# Patient Record
Sex: Male | Born: 1955 | Race: White | Hispanic: No | Marital: Married | Smoking: Former smoker
Health system: Southern US, Community
[De-identification: ages and names within clinical notes are randomized; demographics above are authoritative.]

## PROBLEM LIST (undated history)

## (undated) DIAGNOSIS — I10 Essential (primary) hypertension: Secondary | ICD-10-CM

## (undated) DIAGNOSIS — F32A Depression, unspecified: Secondary | ICD-10-CM

## (undated) DIAGNOSIS — F329 Major depressive disorder, single episode, unspecified: Secondary | ICD-10-CM

## (undated) DIAGNOSIS — M1990 Unspecified osteoarthritis, unspecified site: Secondary | ICD-10-CM

## (undated) DIAGNOSIS — J189 Pneumonia, unspecified organism: Secondary | ICD-10-CM

## (undated) DIAGNOSIS — G4730 Sleep apnea, unspecified: Secondary | ICD-10-CM

## (undated) DIAGNOSIS — Z86718 Personal history of other venous thrombosis and embolism: Secondary | ICD-10-CM

## (undated) HISTORY — PX: JOINT REPLACEMENT: SHX530

## (undated) HISTORY — PX: COLONOSCOPY: SHX174

## (undated) HISTORY — PX: IVC FILTER INSERTION: CATH118245

## (undated) HISTORY — PX: VASOTOMY: SUR1432

## (undated) HISTORY — PX: ESOPHAGOGASTRODUODENOSCOPY: SHX1529

## (undated) HISTORY — PX: BACK SURGERY: SHX140

---

## 2016-10-31 ENCOUNTER — Other Ambulatory Visit: Payer: Self-pay | Admitting: Orthopedic Surgery

## 2016-10-31 DIAGNOSIS — M5416 Radiculopathy, lumbar region: Secondary | ICD-10-CM

## 2016-11-04 ENCOUNTER — Other Ambulatory Visit: Payer: Self-pay | Admitting: Orthopedic Surgery

## 2016-11-04 DIAGNOSIS — M545 Low back pain, unspecified: Secondary | ICD-10-CM

## 2016-11-04 DIAGNOSIS — T1590XA Foreign body on external eye, part unspecified, unspecified eye, initial encounter: Secondary | ICD-10-CM

## 2016-11-14 ENCOUNTER — Ambulatory Visit
Admission: RE | Admit: 2016-11-14 | Discharge: 2016-11-14 | Disposition: A | Discharge status: Home / Self Care | Payer: Medicare PPO | Source: Ambulatory Visit | Attending: Orthopedic Surgery | Admitting: Orthopedic Surgery

## 2016-11-14 DIAGNOSIS — T1590XA Foreign body on external eye, part unspecified, unspecified eye, initial encounter: Secondary | ICD-10-CM

## 2016-11-14 DIAGNOSIS — M545 Low back pain, unspecified: Secondary | ICD-10-CM

## 2016-11-14 DIAGNOSIS — M5416 Radiculopathy, lumbar region: Secondary | ICD-10-CM

## 2017-11-05 ENCOUNTER — Other Ambulatory Visit: Payer: Self-pay | Admitting: Orthopedic Surgery

## 2017-11-10 NOTE — Pre-Procedure Instructions (Signed)
Patience Muscahomas Denison  11/10/2017      [id number] - [place], [place] - [id number] [place] [id number] Phone: [phone] Fax: [phone]    Your procedure is scheduled on June 13  [facility] at 1000 A.M.  Call this number if you have problems the morning of surgery:  [phone]   Remember:  No food or drink after midnight.     Take these medicines the morning of surgery with A SIP OF WATER  Cyclobenzaprine (Flexeril)  Gabapentin (Neurontin) Hydrocodone (Norco) if needed Methocarbamol (Robaxin) Escitalopram (Lexapro)  Stop taking Eliquis as directed by your Dr. Stop taking Celebrex, Aspirin, BC's, goody's, Herbal medications, Fish Oil, Aleve, Ibuprofen, Advil, motrin, Vitamins     Do not wear jewelry, make-up or nail polish.  Do not wear lotions, powders, or perfumes, or deodorant.  Do not shave 48 hours prior to surgery.  Men may shave face and neck.  Do not bring valuables to the hospital.  [facility] is not responsible for any belongings or valuables.  Contacts, dentures or bridgework may not be worn into surgery.  Leave your suitcase in the car.  After surgery it may be brought to your room.  For patients admitted to the hospital, discharge time will be determined by your treatment team.  Patients discharged the day of surgery will not be allowed to drive home.    Special instructions:   [facility]- Preparing For Surgery  Before surgery, you can play an important role. Because skin is not sterile, your skin needs to be as free of germs as possible. You can reduce the number of germs on your skin by washing with CHG (chlorahexidine gluconate) Soap before surgery.  CHG is an antiseptic cleaner which kills germs and bonds with the skin to continue killing germs even after washing.    Oral Hygiene is also important to reduce your risk of infection.  Remember - BRUSH YOUR TEETH THE MORNING OF SURGERY  WITH YOUR REGULAR TOOTHPASTE  Please do not use if you have an allergy to CHG or antibacterial soaps. If your skin becomes reddened/irritated stop using the CHG.  Do not shave (including legs and underarms) for at least 48 hours prior to first CHG shower. It is OK to shave your face.  Please follow these instructions carefully.   1. Shower the NIGHT BEFORE SURGERY and the MORNING OF SURGERY with CHG.   2. If you chose to wash your hair, wash your hair first as usual with your normal shampoo.  3. After you shampoo, rinse your hair and body thoroughly to remove the shampoo.  4. Use CHG as you would any other liquid soap. You can apply CHG directly to the skin and wash gently with a scrungie or a clean washcloth.   5. Apply the CHG Soap to your body ONLY FROM THE NECK DOWN.  Do not use on open wounds or open sores. Avoid contact with your eyes, ears, mouth and genitals (private parts). Wash Face and genitals (private parts)  with your normal soap.  6. Wash thoroughly, paying special attention to the area where your surgery will be performed.  7. Thoroughly rinse your body with warm water from the neck down.  8. DO NOT shower/wash with your normal soap after using and rinsing off the CHG Soap.  9. Pat yourself dry with a CLEAN TOWEL.  10. Wear CLEAN PAJAMAS to bed the night before surgery, wear  comfortable clothes the morning of surgery  11. Place CLEAN SHEETS on your bed the night of your first shower and DO NOT SLEEP WITH PETS.    Day of Surgery:  Do not apply any deodorants/lotions.  Please wear clean clothes to the hospital/surgery center.   Remember to brush your teeth WITH YOUR REGULAR TOOTHPASTE.    Please read over the following fact sheets that you were given. Pain Booklet, Coughing and Deep Breathing, MRSA Information and Surgical Site Infection Prevention, incentive spirometry

## 2017-11-11 ENCOUNTER — Other Ambulatory Visit: Payer: Self-pay

## 2017-11-11 ENCOUNTER — Encounter (HOSPITAL_COMMUNITY)
Admission: RE | Admit: 2017-11-11 | Discharge: 2017-11-11 | Disposition: A | Discharge status: Home / Self Care | Payer: Medicare HMO | Source: Ambulatory Visit | Attending: Orthopedic Surgery | Admitting: Orthopedic Surgery

## 2017-11-11 ENCOUNTER — Encounter (HOSPITAL_COMMUNITY): Payer: Self-pay

## 2017-11-11 DIAGNOSIS — Z01818 Encounter for other preprocedural examination: Secondary | ICD-10-CM | POA: Insufficient documentation

## 2017-11-11 DIAGNOSIS — Z96649 Presence of unspecified artificial hip joint: Secondary | ICD-10-CM

## 2017-11-11 DIAGNOSIS — G4730 Sleep apnea, unspecified: Secondary | ICD-10-CM | POA: Diagnosis not present

## 2017-11-11 DIAGNOSIS — F329 Major depressive disorder, single episode, unspecified: Secondary | ICD-10-CM | POA: Diagnosis not present

## 2017-11-11 DIAGNOSIS — I10 Essential (primary) hypertension: Secondary | ICD-10-CM | POA: Diagnosis not present

## 2017-11-11 DIAGNOSIS — Z791 Long term (current) use of non-steroidal anti-inflammatories (NSAID): Secondary | ICD-10-CM | POA: Diagnosis not present

## 2017-11-11 DIAGNOSIS — Z96643 Presence of artificial hip joint, bilateral: Secondary | ICD-10-CM | POA: Diagnosis not present

## 2017-11-11 DIAGNOSIS — Z79891 Long term (current) use of opiate analgesic: Secondary | ICD-10-CM | POA: Diagnosis not present

## 2017-11-11 DIAGNOSIS — Z86718 Personal history of other venous thrombosis and embolism: Secondary | ICD-10-CM

## 2017-11-11 DIAGNOSIS — M5412 Radiculopathy, cervical region: Secondary | ICD-10-CM | POA: Diagnosis not present

## 2017-11-11 DIAGNOSIS — M4802 Spinal stenosis, cervical region: Secondary | ICD-10-CM | POA: Diagnosis not present

## 2017-11-11 DIAGNOSIS — M79602 Pain in left arm: Secondary | ICD-10-CM | POA: Diagnosis present

## 2017-11-11 DIAGNOSIS — Z87891 Personal history of nicotine dependence: Secondary | ICD-10-CM | POA: Diagnosis not present

## 2017-11-11 DIAGNOSIS — Z79899 Other long term (current) drug therapy: Secondary | ICD-10-CM | POA: Diagnosis not present

## 2017-11-11 HISTORY — DX: Major depressive disorder, single episode, unspecified: F32.9

## 2017-11-11 HISTORY — DX: Essential (primary) hypertension: I10

## 2017-11-11 HISTORY — DX: Depression, unspecified: F32.A

## 2017-11-11 HISTORY — DX: Sleep apnea, unspecified: G47.30

## 2017-11-11 HISTORY — DX: Unspecified osteoarthritis, unspecified site: M19.90

## 2017-11-11 HISTORY — DX: Personal history of other venous thrombosis and embolism: Z86.718

## 2017-11-11 HISTORY — DX: Pneumonia, unspecified organism: J18.9

## 2017-11-11 LAB — CBC WITH DIFFERENTIAL/PLATELET
Abs Immature Granulocytes: 0 10*3/uL (ref 0.0–0.1)
BASOS PCT: 0 %
Basophils Absolute: 0 10*3/uL (ref 0.0–0.1)
EOS ABS: 0.1 10*3/uL (ref 0.0–0.7)
EOS PCT: 2 %
HEMATOCRIT: 44 % (ref 39.0–52.0)
Hemoglobin: 14.6 g/dL (ref 13.0–17.0)
IMMATURE GRANULOCYTES: 0 %
LYMPHS ABS: 1.3 10*3/uL (ref 0.7–4.0)
Lymphocytes Relative: 19 %
MCH: 32.5 pg (ref 26.0–34.0)
MCHC: 33.2 g/dL (ref 30.0–36.0)
MCV: 98 fL (ref 78.0–100.0)
Monocytes Absolute: 0.6 10*3/uL (ref 0.1–1.0)
Monocytes Relative: 9 %
NEUTROS PCT: 70 %
Neutro Abs: 4.7 10*3/uL (ref 1.7–7.7)
Platelets: 211 10*3/uL (ref 150–400)
RBC: 4.49 MIL/uL (ref 4.22–5.81)
RDW: 12.1 % (ref 11.5–15.5)
WBC: 6.7 10*3/uL (ref 4.0–10.5)

## 2017-11-11 LAB — URINALYSIS, ROUTINE W REFLEX MICROSCOPIC
BACTERIA UA: NONE SEEN
BILIRUBIN URINE: NEGATIVE
Glucose, UA: NEGATIVE mg/dL
Hgb urine dipstick: NEGATIVE
KETONES UR: NEGATIVE mg/dL
LEUKOCYTES UA: NEGATIVE
NITRITE: NEGATIVE
Protein, ur: NEGATIVE mg/dL
Specific Gravity, Urine: 1.015 (ref 1.005–1.030)
pH: 5 (ref 5.0–8.0)

## 2017-11-11 LAB — COMPREHENSIVE METABOLIC PANEL
ALT: 43 U/L (ref 17–63)
AST: 35 U/L (ref 15–41)
Albumin: 4.3 g/dL (ref 3.5–5.0)
Alkaline Phosphatase: 62 U/L (ref 38–126)
Anion gap: 9 (ref 5–15)
BILIRUBIN TOTAL: 0.6 mg/dL (ref 0.3–1.2)
BUN: 10 mg/dL (ref 6–20)
CO2: 26 mmol/L (ref 22–32)
CREATININE: 1.05 mg/dL (ref 0.61–1.24)
Calcium: 9.6 mg/dL (ref 8.9–10.3)
Chloride: 104 mmol/L (ref 101–111)
GFR calc non Af Amer: >60 mL/min (ref >60)
Glucose, Bld: 144 mg/dL — ABNORMAL HIGH (ref 65–99)
POTASSIUM: 4.3 mmol/L (ref 3.5–5.1)
Sodium: 139 mmol/L (ref 135–145)
Total Protein: 7.4 g/dL (ref 6.5–8.1)

## 2017-11-11 LAB — ABO/RH: ABO/RH(D): A POS

## 2017-11-11 LAB — PROTIME-INR
INR: 0.99
PROTHROMBIN TIME: 13 s (ref 11.4–15.2)

## 2017-11-11 LAB — TYPE AND SCREEN
ABO/RH(D): A POS
ANTIBODY SCREEN: NEGATIVE

## 2017-11-11 LAB — SURGICAL PCR SCREEN
MRSA, PCR: NEGATIVE
STAPHYLOCOCCUS AUREUS: NEGATIVE

## 2017-11-11 LAB — APTT: aPTT: 26 seconds (ref 24–36)

## 2017-11-11 NOTE — Progress Notes (Signed)
PCP is Dr. Barney DrainMoogali Arvind Denies seeing a cardiologist. Denies chest pain, cough, or fever. States last dose of Eliquis was 11-05-17 States he had lots of blood clots after his first hip replacement and was place on Eliquis along with having an IVC filter placed  Instructed to bring CPAP mask on the day of surgery- voices understanding. Requested discharge summary and EKG done at [facility] last month. Requested EKG done at Dr Flossie BuffyArvind's office and last office note.

## 2017-11-12 NOTE — Progress Notes (Signed)
Anesthesia Chart Review:   Case:  [id number] Date/Time:  11/13/17 1145   Procedure:  ANTERIOR CERVICAL DECOMPRESSION FUSION, CERVICAL 4-5, CERVICAL 5-6, CERVICAL 6-7 WITH INSTRUMENTATION AND ALLOGRAFT.  TIME REQUESTED 4 HOURS (Bilateral )   Anesthesia type:  General   Pre-op diagnosis:  BILATERAL ARM PAIN   Location:  [facility] ROOM 05 / [facility] OR   Surgeon:  Estill Bambergumonski, Mark, MD      DISCUSSION: - Pt is a 62 year old male with hx HTN, OSA.  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   - Last dose eliquis 11/05/17   VS: BP (!) 164/88   Pulse 92   Temp 36.8 C   Resp 20   Ht 5\' 9"  (1.753 m)   Wt 213 lb 3.2 oz (96.7 kg)   SpO2 99%   BMI 31.48 kg/m   PROVIDERS: PCP is Karle PlumberArvind, Moogali M, MD   LABS: Labs reviewed: Acceptable for surgery. (all labs ordered are listed, but only abnormal results are displayed)  Labs Reviewed  COMPREHENSIVE METABOLIC PANEL - Abnormal; Notable for the following components:      Result Value   Glucose, Bld 144 (*)    All other components within normal limits  SURGICAL PCR SCREEN  APTT  CBC WITH DIFFERENTIAL/PLATELET  PROTIME-INR  URINALYSIS, ROUTINE W REFLEX MICROSCOPIC  TYPE AND SCREEN  ABO/RH     IMAGES:  CXR 11/11/17: No acute cardiopulmonary disease.   [facility]. If it does not arrive in time, EKG will be obtained day of surgery     Past Medical History:  Diagnosis Date  . Arthritis   . Depression   . 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 Hypertension   . Pneumonia   . Sleep apnea     Past Surgical History:  Procedure Laterality Date  . BACK SURGERY     times 2  . COLONOSCOPY    . ESOPHAGOGASTRODUODENOSCOPY    . IVC FILTER INSERTION    . JOINT REPLACEMENT Bilateral   . VASOTOMY      MEDICATIONS: . apixaban (ELIQUIS) 2.5 MG TABS tablet  . celecoxib (CELEBREX) 200 MG capsule  . cyclobenzaprine (FLEXERIL) 5 MG tablet  . escitalopram (LEXAPRO) 10  MG tablet  . gabapentin (NEURONTIN) 600 MG tablet  . HYDROcodone-acetaminophen (NORCO) 10-325 MG tablet  . lisinopril (PRINIVIL,ZESTRIL) 10 MG tablet  . methocarbamol (ROBAXIN) 500 MG tablet  . traZODone (DESYREL) 150 MG tablet   No current facility-administered medications for this encounter.     If EKG acceptable day of surgery, I anticipate pt can proceed with surgery as scheduled.  Rica Mastngela Nidya Bouyer, FNP-BC [facility]Anesthesiology Phone: [phone] 11/12/2017 4:44 PM

## 2017-11-13 ENCOUNTER — Ambulatory Visit (HOSPITAL_COMMUNITY): Payer: Medicare HMO

## 2017-11-13 ENCOUNTER — Encounter (HOSPITAL_COMMUNITY): Payer: Self-pay

## 2017-11-13 ENCOUNTER — Ambulatory Visit (HOSPITAL_COMMUNITY): Payer: Medicare HMO | Admitting: Anesthesiology

## 2017-11-13 ENCOUNTER — Observation Stay (HOSPITAL_COMMUNITY)
Admission: RE | Admit: 2017-11-13 | Discharge: 2017-11-14 | Disposition: A | Discharge status: Home / Self Care | Payer: Medicare HMO | Source: Ambulatory Visit | Attending: Orthopedic Surgery | Admitting: Orthopedic Surgery

## 2017-11-13 ENCOUNTER — Encounter (HOSPITAL_COMMUNITY)
Admission: RE | Disposition: A | Discharge status: Home / Self Care | Payer: Self-pay | Source: Ambulatory Visit | Attending: Orthopedic Surgery

## 2017-11-13 ENCOUNTER — Ambulatory Visit (HOSPITAL_COMMUNITY): Payer: Medicare HMO | Admitting: Emergency Medicine

## 2017-11-13 DIAGNOSIS — G4730 Sleep apnea, unspecified: Secondary | ICD-10-CM | POA: Insufficient documentation

## 2017-11-13 DIAGNOSIS — Z79899 Other long term (current) drug therapy: Secondary | ICD-10-CM | POA: Insufficient documentation

## 2017-11-13 DIAGNOSIS — Z419 Encounter for procedure for purposes other than remedying health state, unspecified: Secondary | ICD-10-CM

## 2017-11-13 DIAGNOSIS — F329 Major depressive disorder, single episode, unspecified: Secondary | ICD-10-CM | POA: Insufficient documentation

## 2017-11-13 DIAGNOSIS — M5412 Radiculopathy, cervical region: Principal | ICD-10-CM | POA: Insufficient documentation

## 2017-11-13 DIAGNOSIS — Z96643 Presence of artificial hip joint, bilateral: Secondary | ICD-10-CM | POA: Insufficient documentation

## 2017-11-13 DIAGNOSIS — Z87891 Personal history of nicotine dependence: Secondary | ICD-10-CM | POA: Insufficient documentation

## 2017-11-13 DIAGNOSIS — M4802 Spinal stenosis, cervical region: Secondary | ICD-10-CM | POA: Diagnosis not present

## 2017-11-13 DIAGNOSIS — I10 Essential (primary) hypertension: Secondary | ICD-10-CM | POA: Insufficient documentation

## 2017-11-13 DIAGNOSIS — M5410 Radiculopathy, site unspecified: Secondary | ICD-10-CM | POA: Diagnosis present

## 2017-11-13 DIAGNOSIS — Z791 Long term (current) use of non-steroidal anti-inflammatories (NSAID): Secondary | ICD-10-CM | POA: Insufficient documentation

## 2017-11-13 DIAGNOSIS — Z79891 Long term (current) use of opiate analgesic: Secondary | ICD-10-CM | POA: Insufficient documentation

## 2017-11-13 HISTORY — PX: ANTERIOR CERVICAL DECOMP/DISCECTOMY FUSION: SHX1161

## 2017-11-13 SURGERY — ANTERIOR CERVICAL DECOMPRESSION/DISCECTOMY FUSION 2 LEVELS
Anesthesia: General | Laterality: Bilateral

## 2017-11-13 MED ORDER — DIAZEPAM 5 MG PO TABS
5.0000 mg | ORAL_TABLET | Freq: Four times a day (QID) | ORAL | Status: DC | PRN
  Administered 2017-11-13 – 2017-11-14 (×3): 5 mg via ORAL
  Filled 2017-11-13 (×2): qty 1

## 2017-11-13 MED ORDER — MIDAZOLAM HCL 2 MG/2ML IJ SOLN
INTRAMUSCULAR | Status: AC
  Filled 2017-11-13: qty 2

## 2017-11-13 MED ORDER — PANTOPRAZOLE SODIUM 40 MG PO TBEC: 40.0000 mg | DELAYED_RELEASE_TABLET | Freq: Every day | ORAL | Status: DC

## 2017-11-13 MED ORDER — OXYCODONE-ACETAMINOPHEN 5-325 MG PO TABS
1.0000 | ORAL_TABLET | ORAL | Status: DC | PRN
  Administered 2017-11-13 – 2017-11-14 (×5): 2 via ORAL
  Filled 2017-11-13 (×4): qty 2

## 2017-11-13 MED ORDER — DOCUSATE SODIUM 100 MG PO CAPS
100.0000 mg | ORAL_CAPSULE | Freq: Two times a day (BID) | ORAL | Status: DC
  Administered 2017-11-13: 100 mg via ORAL
  Filled 2017-11-13: qty 1

## 2017-11-13 MED ORDER — ONDANSETRON HCL 4 MG/2ML IJ SOLN
INTRAMUSCULAR | Status: AC
  Filled 2017-11-13: qty 2

## 2017-11-13 MED ORDER — PHENYLEPHRINE 40 MCG/ML (10ML) SYRINGE FOR IV PUSH (FOR BLOOD PRESSURE SUPPORT)
PREFILLED_SYRINGE | INTRAVENOUS | Status: DC | PRN
  Administered 2017-11-13 (×3): 80 ug via INTRAVENOUS

## 2017-11-13 MED ORDER — SODIUM CHLORIDE 0.9% FLUSH: 3.0000 mL | Freq: Two times a day (BID) | INTRAVENOUS | Status: DC

## 2017-11-13 MED ORDER — FENTANYL CITRATE (PF) 250 MCG/5ML IJ SOLN
INTRAMUSCULAR | Status: AC
  Filled 2017-11-13: qty 5

## 2017-11-13 MED ORDER — ONDANSETRON HCL 4 MG/2ML IJ SOLN
INTRAMUSCULAR | Status: DC | PRN
  Administered 2017-11-13: 4 mg via INTRAVENOUS

## 2017-11-13 MED ORDER — HYDROMORPHONE HCL 1 MG/ML IJ SOLN
INTRAMUSCULAR | Status: AC
  Filled 2017-11-13: qty 0.5

## 2017-11-13 MED ORDER — CEFAZOLIN SODIUM-DEXTROSE 2-4 GM/100ML-% IV SOLN
2.0000 g | Freq: Three times a day (TID) | INTRAVENOUS | Status: AC
  Administered 2017-11-13 – 2017-11-14 (×2): 2 g via INTRAVENOUS
  Filled 2017-11-13 (×2): qty 100

## 2017-11-13 MED ORDER — PHENYLEPHRINE HCL 10 MG/ML IJ SOLN
INTRAMUSCULAR | Status: DC | PRN
  Administered 2017-11-13: 60 ug/min via INTRAVENOUS

## 2017-11-13 MED ORDER — ONDANSETRON HCL 4 MG/2ML IJ SOLN: 4.0000 mg | Freq: Four times a day (QID) | INTRAMUSCULAR | Status: DC | PRN

## 2017-11-13 MED ORDER — PHENYLEPHRINE 40 MCG/ML (10ML) SYRINGE FOR IV PUSH (FOR BLOOD PRESSURE SUPPORT)
PREFILLED_SYRINGE | INTRAVENOUS | Status: AC
  Filled 2017-11-13: qty 10

## 2017-11-13 MED ORDER — SUGAMMADEX SODIUM 200 MG/2ML IV SOLN
INTRAVENOUS | Status: AC
  Filled 2017-11-13: qty 2

## 2017-11-13 MED ORDER — BUPIVACAINE-EPINEPHRINE 0.25% -1:200000 IJ SOLN
INTRAMUSCULAR | Status: DC | PRN
  Administered 2017-11-13: 10 mL

## 2017-11-13 MED ORDER — TRAZODONE HCL 150 MG PO TABS
150.0000 mg | ORAL_TABLET | Freq: Every day | ORAL | Status: DC
  Administered 2017-11-13: 150 mg via ORAL
  Filled 2017-11-13: qty 1

## 2017-11-13 MED ORDER — ESCITALOPRAM OXALATE 10 MG PO TABS
10.0000 mg | ORAL_TABLET | Freq: Every day | ORAL | Status: DC
  Filled 2017-11-13: qty 1

## 2017-11-13 MED ORDER — ARTIFICIAL TEARS OPHTHALMIC OINT
TOPICAL_OINTMENT | OPHTHALMIC | Status: AC
  Filled 2017-11-13: qty 3.5

## 2017-11-13 MED ORDER — LACTATED RINGERS IV SOLN
INTRAVENOUS | Status: DC | PRN
  Administered 2017-11-13 (×2): via INTRAVENOUS

## 2017-11-13 MED ORDER — LABETALOL HCL 5 MG/ML IV SOLN
INTRAVENOUS | Status: AC
  Filled 2017-11-13: qty 4

## 2017-11-13 MED ORDER — ARTIFICIAL TEARS OPHTHALMIC OINT
TOPICAL_OINTMENT | OPHTHALMIC | Status: DC | PRN
  Administered 2017-11-13: 1 via OPHTHALMIC

## 2017-11-13 MED ORDER — HYDROMORPHONE HCL 2 MG/ML IJ SOLN
0.2500 mg | INTRAMUSCULAR | Status: DC | PRN
  Administered 2017-11-13: 0.5 mg via INTRAVENOUS

## 2017-11-13 MED ORDER — SUGAMMADEX SODIUM 200 MG/2ML IV SOLN
INTRAVENOUS | Status: DC | PRN
  Administered 2017-11-13: 300 mg via INTRAVENOUS

## 2017-11-13 MED ORDER — SODIUM CHLORIDE 0.9% FLUSH: 3.0000 mL | INTRAVENOUS | Status: DC | PRN

## 2017-11-13 MED ORDER — LABETALOL HCL 5 MG/ML IV SOLN
INTRAVENOUS | Status: DC | PRN
  Administered 2017-11-13: 10 mg via INTRAVENOUS

## 2017-11-13 MED ORDER — THROMBIN 20000 UNITS EX SOLR
CUTANEOUS | Status: DC | PRN
  Administered 2017-11-13: 11:00:00 via TOPICAL

## 2017-11-13 MED ORDER — ZOLPIDEM TARTRATE 5 MG PO TABS: 5.0000 mg | ORAL_TABLET | Freq: Every evening | ORAL | Status: DC | PRN

## 2017-11-13 MED ORDER — ALUM & MAG HYDROXIDE-SIMETH 200-200-20 MG/5ML PO SUSP: 30.0000 mL | Freq: Four times a day (QID) | ORAL | Status: DC | PRN

## 2017-11-13 MED ORDER — ROCURONIUM BROMIDE 10 MG/ML (PF) SYRINGE
PREFILLED_SYRINGE | INTRAVENOUS | Status: AC
  Filled 2017-11-13: qty 10

## 2017-11-13 MED ORDER — POVIDONE-IODINE 7.5 % EX SOLN
Freq: Once | CUTANEOUS | Status: DC
  Filled 2017-11-13: qty 118

## 2017-11-13 MED ORDER — FENTANYL CITRATE (PF) 100 MCG/2ML IJ SOLN
INTRAMUSCULAR | Status: DC | PRN
  Administered 2017-11-13 (×2): 50 ug via INTRAVENOUS
  Administered 2017-11-13: 100 ug via INTRAVENOUS
  Administered 2017-11-13 (×2): 50 ug via INTRAVENOUS
  Administered 2017-11-13: 100 ug via INTRAVENOUS
  Administered 2017-11-13 (×2): 50 ug via INTRAVENOUS

## 2017-11-13 MED ORDER — DIAZEPAM 5 MG PO TABS
ORAL_TABLET | ORAL | Status: AC
  Filled 2017-11-13: qty 1

## 2017-11-13 MED ORDER — BUPIVACAINE-EPINEPHRINE (PF) 0.25% -1:200000 IJ SOLN
INTRAMUSCULAR | Status: AC
  Filled 2017-11-13: qty 30

## 2017-11-13 MED ORDER — ONDANSETRON HCL 4 MG PO TABS: 4.0000 mg | ORAL_TABLET | Freq: Four times a day (QID) | ORAL | Status: DC | PRN

## 2017-11-13 MED ORDER — MORPHINE SULFATE (PF) 2 MG/ML IV SOLN
1.0000 mg | INTRAVENOUS | Status: DC | PRN
  Administered 2017-11-13: 2 mg via INTRAVENOUS
  Filled 2017-11-13: qty 1

## 2017-11-13 MED ORDER — OXYCODONE-ACETAMINOPHEN 5-325 MG PO TABS
ORAL_TABLET | ORAL | Status: AC
  Filled 2017-11-13: qty 2

## 2017-11-13 MED ORDER — MIDAZOLAM HCL 5 MG/5ML IJ SOLN
INTRAMUSCULAR | Status: DC | PRN
  Administered 2017-11-13 (×2): 2 mg via INTRAVENOUS

## 2017-11-13 MED ORDER — PROPOFOL 10 MG/ML IV BOLUS
INTRAVENOUS | Status: DC | PRN
  Administered 2017-11-13: 200 mg via INTRAVENOUS

## 2017-11-13 MED ORDER — ROCURONIUM BROMIDE 100 MG/10ML IV SOLN
INTRAVENOUS | Status: DC | PRN
  Administered 2017-11-13: 20 mg via INTRAVENOUS
  Administered 2017-11-13: 10 mg via INTRAVENOUS
  Administered 2017-11-13: 50 mg via INTRAVENOUS
  Administered 2017-11-13: 10 mg via INTRAVENOUS
  Administered 2017-11-13: 20 mg via INTRAVENOUS
  Administered 2017-11-13: 30 mg via INTRAVENOUS

## 2017-11-13 MED ORDER — THROMBIN 20000 UNITS EX SOLR
CUTANEOUS | Status: AC
  Filled 2017-11-13: qty 20000

## 2017-11-13 MED ORDER — DEXAMETHASONE SODIUM PHOSPHATE 10 MG/ML IJ SOLN
INTRAMUSCULAR | Status: AC
  Filled 2017-11-13: qty 1

## 2017-11-13 MED ORDER — ACETAMINOPHEN 650 MG RE SUPP: 650.0000 mg | RECTAL | Status: DC | PRN

## 2017-11-13 MED ORDER — FLEET ENEMA 7-19 GM/118ML RE ENEM: 1.0000 | ENEMA | Freq: Once | RECTAL | Status: DC | PRN

## 2017-11-13 MED ORDER — LISINOPRIL 10 MG PO TABS
10.0000 mg | ORAL_TABLET | Freq: Every day | ORAL | Status: DC
  Administered 2017-11-13 – 2017-11-14 (×2): 10 mg via ORAL
  Filled 2017-11-13 (×2): qty 1

## 2017-11-13 MED ORDER — CEFAZOLIN SODIUM-DEXTROSE 2-4 GM/100ML-% IV SOLN
2.0000 g | INTRAVENOUS | Status: AC
  Administered 2017-11-13: 2 g via INTRAVENOUS
  Filled 2017-11-13: qty 100

## 2017-11-13 MED ORDER — GABAPENTIN 600 MG PO TABS
600.0000 mg | ORAL_TABLET | Freq: Three times a day (TID) | ORAL | Status: DC
  Administered 2017-11-13 (×2): 600 mg via ORAL
  Filled 2017-11-13 (×2): qty 1

## 2017-11-13 MED ORDER — PHENOL 1.4 % MT LIQD: 1.0000 | OROMUCOSAL | Status: DC | PRN

## 2017-11-13 MED ORDER — DEXAMETHASONE SODIUM PHOSPHATE 4 MG/ML IJ SOLN
INTRAMUSCULAR | Status: DC | PRN
  Administered 2017-11-13: 10 mg via INTRAVENOUS

## 2017-11-13 MED ORDER — SENNOSIDES-DOCUSATE SODIUM 8.6-50 MG PO TABS: 1.0000 | ORAL_TABLET | Freq: Every evening | ORAL | Status: DC | PRN

## 2017-11-13 MED ORDER — LIDOCAINE 2% (20 MG/ML) 5 ML SYRINGE
INTRAMUSCULAR | Status: DC | PRN
  Administered 2017-11-13: 60 mg via INTRAVENOUS

## 2017-11-13 MED ORDER — BISACODYL 5 MG PO TBEC: 5.0000 mg | DELAYED_RELEASE_TABLET | Freq: Every day | ORAL | Status: DC | PRN

## 2017-11-13 MED ORDER — LACTATED RINGERS IV SOLN
INTRAVENOUS | Status: DC
  Administered 2017-11-13: 08:00:00 via INTRAVENOUS

## 2017-11-13 MED ORDER — 0.9 % SODIUM CHLORIDE (POUR BTL) OPTIME
TOPICAL | Status: DC | PRN
  Administered 2017-11-13: 1000 mL

## 2017-11-13 MED ORDER — LISINOPRIL 10 MG PO TABS
10.0000 mg | ORAL_TABLET | Freq: Once | ORAL | Status: AC
  Administered 2017-11-13: 10 mg via ORAL
  Filled 2017-11-13: qty 1

## 2017-11-13 MED ORDER — MENTHOL 3 MG MT LOZG: 1.0000 | LOZENGE | OROMUCOSAL | Status: DC | PRN

## 2017-11-13 MED ORDER — ACETAMINOPHEN 325 MG PO TABS: 650.0000 mg | ORAL_TABLET | ORAL | Status: DC | PRN

## 2017-11-13 SURGICAL SUPPLY — 78 items
BENZOIN TINCTURE PRP APPL 2/3 (GAUZE/BANDAGES/DRESSINGS) ×3 IMPLANT
BIT DRILL NEURO 2X3.1 SFT TUCH (MISCELLANEOUS) ×1 IMPLANT
BIT DRILL SRG 14X2.2XFLT CHK (BIT) ×1 IMPLANT
BIT DRL SRG 14X2.2XFLT CHK (BIT) ×1
BLADE CLIPPER SURG (BLADE) ×3 IMPLANT
BLADE SURG 15 STRL LF DISP TIS (BLADE) ×1 IMPLANT
BLADE SURG 15 STRL SS (BLADE) ×2
BONE VIVIGEN FORMABLE 1.3CC (Bone Implant) ×6 IMPLANT
BUR MATCHSTICK NEURO 3.0 LAGG (BURR) IMPLANT
CARTRIDGE OIL MAESTRO DRILL (MISCELLANEOUS) ×1 IMPLANT
CLOSURE WOUND 1/2 X4 (GAUZE/BANDAGES/DRESSINGS) ×1
COLLAR CERV LO CONTOUR FIRM DE (SOFTGOODS) IMPLANT
CORDS BIPOLAR (ELECTRODE) ×3 IMPLANT
COVER SURGICAL LIGHT HANDLE (MISCELLANEOUS) IMPLANT
CRADLE DONUT ADULT HEAD (MISCELLANEOUS) ×3 IMPLANT
DECANTER SPIKE VIAL GLASS SM (MISCELLANEOUS) ×3 IMPLANT
DIFFUSER DRILL AIR PNEUMATIC (MISCELLANEOUS) ×3 IMPLANT
DRAIN JACKSON RD 7FR 3/32 (WOUND CARE) IMPLANT
DRAPE C-ARM 42X72 X-RAY (DRAPES) ×6 IMPLANT
DRAPE POUCH INSTRU U-SHP 10X18 (DRAPES) ×3 IMPLANT
DRAPE SURG 17X23 STRL (DRAPES) ×12 IMPLANT
DRILL BIT SKYLINE 14MM (BIT) ×2
DRILL NEURO 2X3.1 SOFT TOUCH (MISCELLANEOUS) ×3
DURAPREP 26ML APPLICATOR (WOUND CARE) ×3 IMPLANT
ELECT COATED BLADE 2.86 ST (ELECTRODE) ×3 IMPLANT
ELECT REM PT RETURN 9FT ADLT (ELECTROSURGICAL) ×3
ELECTRODE REM PT RTRN 9FT ADLT (ELECTROSURGICAL) ×1 IMPLANT
EVACUATOR SILICONE 100CC (DRAIN) IMPLANT
GAUZE SPONGE 4X4 12PLY STRL (GAUZE/BANDAGES/DRESSINGS) ×3 IMPLANT
GAUZE SPONGE 4X4 16PLY XRAY LF (GAUZE/BANDAGES/DRESSINGS) ×3 IMPLANT
GLOVE BIO SURGEON STRL SZ7 (GLOVE) ×3 IMPLANT
GLOVE BIO SURGEON STRL SZ8 (GLOVE) ×3 IMPLANT
GLOVE BIOGEL PI IND STRL 7.0 (GLOVE) ×2 IMPLANT
GLOVE BIOGEL PI IND STRL 8 (GLOVE) ×1 IMPLANT
GLOVE BIOGEL PI INDICATOR 7.0 (GLOVE) ×4
GLOVE BIOGEL PI INDICATOR 8 (GLOVE) ×2
GOWN STRL REUS W/ TWL LRG LVL3 (GOWN DISPOSABLE) ×1 IMPLANT
GOWN STRL REUS W/ TWL XL LVL3 (GOWN DISPOSABLE) ×1 IMPLANT
GOWN STRL REUS W/TWL LRG LVL3 (GOWN DISPOSABLE) ×2
GOWN STRL REUS W/TWL XL LVL3 (GOWN DISPOSABLE) ×2
INTERLOCK LRDTC CRVCL VBR 6MM (Bone Implant) ×1 IMPLANT
INTERLOCK LRDTC CRVCL VBR 8MM (Peek) ×2 IMPLANT
IV CATH 14GX2 1/4 (CATHETERS) ×3 IMPLANT
KIT BASIN OR (CUSTOM PROCEDURE TRAY) ×3 IMPLANT
KIT TURNOVER KIT B (KITS) ×3 IMPLANT
LORDOTIC CERVICAL VBR 6MM SM (Bone Implant) ×3 IMPLANT
LORDOTIC CERVICAL VBR 8MM SM (Peek) ×6 IMPLANT
MANIFOLD NEPTUNE II (INSTRUMENTS) IMPLANT
NEEDLE PRECISIONGLIDE 27X1.5 (NEEDLE) ×3 IMPLANT
NEEDLE SPNL 20GX3.5 QUINCKE YW (NEEDLE) ×3 IMPLANT
NS IRRIG 1000ML POUR BTL (IV SOLUTION) ×3 IMPLANT
OIL CARTRIDGE MAESTRO DRILL (MISCELLANEOUS) ×3
PACK ORTHO CERVICAL (CUSTOM PROCEDURE TRAY) ×3 IMPLANT
PAD ARMBOARD 7.5X6 YLW CONV (MISCELLANEOUS) ×9 IMPLANT
PATTIES SURGICAL .5 X.5 (GAUZE/BANDAGES/DRESSINGS) IMPLANT
PATTIES SURGICAL .5 X1 (DISPOSABLE) ×3 IMPLANT
PIN DISTRACTION 14 (PIN) ×6 IMPLANT
PLATE SKYLINE THREE LEVEL 51MM (Plate) ×3 IMPLANT
SCREW SKYLINE VAR OS 14MM (Screw) ×24 IMPLANT
SPONGE INTESTINAL PEANUT (DISPOSABLE) ×6 IMPLANT
SPONGE SURGIFOAM ABS GEL 100 (HEMOSTASIS) ×3 IMPLANT
STRIP CLOSURE SKIN 1/2X4 (GAUZE/BANDAGES/DRESSINGS) ×2 IMPLANT
SURGIFLO W/THROMBIN 8M KIT (HEMOSTASIS) IMPLANT
SUT MNCRL AB 4-0 PS2 18 (SUTURE) ×3 IMPLANT
SUT SILK 2 0 TIES 10X30 (SUTURE) ×3 IMPLANT
SUT SILK 4 0 (SUTURE)
SUT SILK 4-0 18XBRD TIE 12 (SUTURE) IMPLANT
SUT VIC AB 2-0 CT2 18 VCP726D (SUTURE) ×3 IMPLANT
SYR 20ML ECCENTRIC (SYRINGE) ×3 IMPLANT
SYR BULB IRRIGATION 50ML (SYRINGE) ×3 IMPLANT
SYR CONTROL 10ML LL (SYRINGE) ×9 IMPLANT
TAPE CLOTH 4X10 WHT NS (GAUZE/BANDAGES/DRESSINGS) ×3 IMPLANT
TAPE CLOTH SURG 6X10 WHT LF (GAUZE/BANDAGES/DRESSINGS) ×3 IMPLANT
TAPE UMBILICAL COTTON 1/8X30 (MISCELLANEOUS) ×3 IMPLANT
TOWEL OR 17X24 6PK STRL BLUE (TOWEL DISPOSABLE) ×3 IMPLANT
TOWEL OR 17X26 10 PK STRL BLUE (TOWEL DISPOSABLE) ×3 IMPLANT
WATER STERILE IRR 1000ML POUR (IV SOLUTION) ×3 IMPLANT
YANKAUER SUCT BULB TIP NO VENT (SUCTIONS) ×3 IMPLANT

## 2017-11-13 NOTE — Anesthesia Preprocedure Evaluation (Signed)
Anesthesia Evaluation  Patient identified by MRN, date of birth, ID band Patient awake    Reviewed: Allergy & Precautions, NPO status   Airway Mallampati: II  TM Distance: >3 FB     Dental   Pulmonary sleep apnea , pneumonia, former smoker,    breath sounds clear to auscultation       Cardiovascular hypertension,  Rhythm:Regular Rate:Normal     Neuro/Psych    GI/Hepatic negative GI ROS, Neg liver ROS,   Endo/Other  negative endocrine ROS  Renal/GU negative Renal ROS     Musculoskeletal   Abdominal   Peds  Hematology   Anesthesia Other Findings   Reproductive/Obstetrics                             Anesthesia Physical Anesthesia Plan  ASA: III  Anesthesia Plan: General   Post-op Pain Management:    Induction: Intravenous  PONV Risk Score and Plan: Treatment may vary due to age or medical condition, Ondansetron, Dexamethasone and Midazolam  Airway Management Planned: Oral ETT  Additional Equipment:   Intra-op Plan:   Post-operative Plan: Extubation in OR  Informed Consent: I have reviewed the patients History and Physical, chart, labs and discussed the procedure including the risks, benefits and alternatives for the proposed anesthesia with the patient or authorized representative who has indicated his/her understanding and acceptance.   Dental advisory given  Plan Discussed with: CRNA and Anesthesiologist  Anesthesia Plan Comments:         Anesthesia Quick Evaluation

## 2017-11-13 NOTE — Anesthesia Postprocedure Evaluation (Signed)
Anesthesia Post Note  Patient: William Mcclain  Procedure(s) Performed: ANTERIOR CERVICAL DECOMPRESSION FUSION, CERVICAL FOUR-FIVE, CERVICAL FIVE-SIX, CERVICAL SIX-SEVEN WITH INSTRUMENTATION AND ALLOGRAFT. (Bilateral )     Patient location during evaluation: PACU Anesthesia Type: General Level of consciousness: awake Pain management: pain level controlled Vital Signs Assessment: post-procedure vital signs reviewed and stable Respiratory status: spontaneous breathing Cardiovascular status: stable Anesthetic complications: no    Last Vitals:  Vitals:   11/13/17 1447 11/13/17 1523  BP: (!) 156/90 (!) 182/93  Pulse: 77 73  Resp: 17 18  Temp:    SpO2: 95% 97%    Last Pain:  Vitals:   11/13/17 1447  TempSrc:   PainSc: Asleep                 Brittne Kawasaki

## 2017-11-13 NOTE — Anesthesia Procedure Notes (Signed)
Procedure Name: Intubation Date/Time: 11/13/2017 10:03 AM Performed by: Julian ReilWelty, Lonnel Gjerde F, CRNA Pre-anesthesia Checklist: Patient identified, Emergency Drugs available, Suction available, Patient being monitored and Timeout performed Patient Re-evaluated:Patient Re-evaluated prior to induction Oxygen Delivery Method: Circle system utilized Preoxygenation: Pre-oxygenation with 100% oxygen Induction Type: IV induction Ventilation: Mask ventilation without difficulty Laryngoscope Size: Miller and 3 Grade View: Grade I Tube type: Oral Tube size: 7.5 mm Number of attempts: 1 Airway Equipment and Method: Stylet Placement Confirmation: ETT inserted through vocal cords under direct vision,  positive ETCO2 and breath sounds checked- equal and bilateral Secured at: 23 cm Tube secured with: Tape Dental Injury: Teeth and Oropharynx as per pre-operative assessment  Comments: DL, noted floppy large epiglottis. 4x4s bite block used.

## 2017-11-13 NOTE — Progress Notes (Signed)
Orthopedic Tech Progress Note Patient Details:  William Mcclain 1956/05/13 [id number]  Ortho Devices Type of Ortho Device: [place] cervical collar Ortho Device/Splint Location: at bedside Ortho Device/Splint Interventions: William DoffingOrdered       William Mcclain 11/13/2017, 3:35 PM

## 2017-11-13 NOTE — Op Note (Signed)
NAME:  William Mcclain                MEDICAL RECORD NO.:  [id number]  PHYSICIAN:  Estill Bamberg, MD      DATE OF BIRTH:  [birth year]  DATE OF PROCEDURE:  11/13/2017                              OPERATIVE REPORT   PREOPERATIVE DIAGNOSES: 1. Bilateral cervical radiculopathy. 2. Spinal stenosis spanning C4-C7.  POSTOPERATIVE DIAGNOSES: 1. Bilateral cervical radiculopathy. 2. Spinal stenosis spanning C4-C7.  PROCEDURE: 1. Anterior cervical decompression and fusion C4/5, C5/6, C6/7. 2. Placement of anterior instrumentation, C4-C7. 3. Insertion of interbody device x3 (Titan intervertebral spacers). 4. Intraoperative use of fluoroscopy. 5. Use of morselized allograft - ViviGen.  SURGEON:  Estill Bamberg, MD  ASSISTANT:  Jason Coop, PA-C.  ANESTHESIA:  General endotracheal anesthesia.  COMPLICATIONS:  None.  DISPOSITION:  Stable.  ESTIMATED BLOOD LOSS:  Minimal.  INDICATIONS FOR SURGERY:  Briefly, Mr. Heard is a pleasant 62 year old male, who did present to me with severe pain in the neck and bilateral arms.  The patient's MRI did reveal the findings noted above.  Given the patient's ongoing rather debilitating pain and lack of improvement with appropriate treatment measures, we did discuss proceeding with the procedure noted above.  The patient was fully aware of the risks and limitations of surgery as outlined in my preoperative note.  OPERATIVE DETAILS:  On 11/13/2017, the patient was brought to surgery and general endotracheal anesthesia was administered.  The patient was placed supine on the hospital bed. The neck was gently extended.  All bony prominences were meticulously padded.  The neck was prepped and draped in the usual sterile fashion.  At this point, I did make a left-sided transverse incision.  The platysma was incised.  A Smith-Robinson approach was used and the anterior spine was identified. A self-retaining retractor was placed.  I  then subperiosteally exposed the vertebral bodies from C4-C7.  Caspar pins were then placed into the C6 and C7 vertebral bodies and distraction was applied.  A thorough and complete C6-7 intervertebral diskectomy was performed.  The posterior longitudinal ligament was identified and entered using a nerve hook.  I then used #1 followed by #2 Kerrison to perform a thorough and complete intervertebral diskectomy.  The spinal canal was thoroughly decompressed, as was the right and left neuroforamen.  The endplates were then prepared and the appropriate-sized intervertebral spacer was then packed with ViviGen and tamped into position in the usual fashion.  The lower Caspar pin was then removed and placed into the C5 vertebral body and once again, distraction was applied across the C5-6 intervertebral space.  I then again performed a thorough and complete diskectomy, thoroughly decompressing the spinal canal and bilateral neuroforamena.  After preparing the endplates, the appropriate-sized intervertebral spacer was packed with ViviGen and tamped into position.  The lower Caspar pin was then removed and placed into the C4 vertebral body and once again, distraction was applied across the C4-5 intervertebral space.  I then again performed a thorough and complete diskectomy, thoroughly decompressing the spinal canal and bilateral neuroforamena.  After preparing the endplates, the appropriate-sized intervertebral spacer was packed with ViviGen and tamped into position.  The Caspar pins then were removed and bone wax was placed in their place.  The appropriate-sized anterior cervical plate was placed over the anterior spine.  14 mm variable angle screws  were placed, 2 in each vertebral body from C4-C7 for a total of 8 vertebral body screws.  The screws were then locked to the plate using the Cam locking mechanism.  I was very pleased with the final fluoroscopic images.  The wound was then irrigated.   The wound was then explored for any undue bleeding and there was no bleeding noted. The wound was then closed in layers using 2-0 Vicryl, followed by 4-0 Monocryl.  Benzoin and Steri-Strips were applied, followed by sterile dressing.  All instrument counts were correct at the termination of the procedure.  Of note, Jason CoopKayla McKenzie, PA-C, was my assistant throughout surgery, and did aid in retraction, suctioning, and closure from start to finish.     Estill BambergMark Ceirra Belli, MD

## 2017-11-13 NOTE — H&P (Signed)
PREOPERATIVE H&P  Chief Complaint: Bilateral arm pain  HPI: William Mcclain is a 62 y.o. male who presents with ongoing pain in the bilateral arms  MRI reveals severe stenosis spanning C4-C7  Patient has failed multiple forms of conservative care and continues to have pain (see office notes for additional details regarding the patient's full course of treatment)  Past Medical History:  Diagnosis Date  . Arthritis   . Depression   . 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 Hypertension   . Pneumonia   . Sleep apnea    Past Surgical History:  Procedure Laterality Date  . BACK SURGERY     times 2  . COLONOSCOPY    . ESOPHAGOGASTRODUODENOSCOPY    . IVC FILTER INSERTION    . JOINT REPLACEMENT Bilateral   . VASOTOMY     Social History   Socioeconomic History  . Marital status: Married    Spouse name: Not on file  . Number of children: Not on file  . Years of education: Not on file  . Highest education level: Not on file  Occupational History  . Not on file  Social Needs  . Financial resource strain: Not on file  . Food insecurity:    Worry: Not on file    Inability: Not on file  . Transportation needs:    Medical: Not on file    Non-medical: Not on file  Tobacco Use  . Smoking status: Former Games developermoker  . Smokeless tobacco: Never Used  Substance and Sexual Activity  . Alcohol use: Yes    Comment: 3 beers a day  . Drug use: Never  . Sexual activity: Not on file  Lifestyle  . Physical activity:    Days per week: Not on file    Minutes per session: Not on file  . Stress: Not on file  Relationships  . Social connections:    Talks on phone: Not on file    Gets together: Not on file    Attends religious service: Not on file    Active member of club or organization: Not on file    Attends meetings of clubs or organizations: Not on file    Relationship status: Not on file  Other Topics Concern  . Not on file  Social History Narrative   . Not on file   Family History  Problem Relation Age of Onset  . Lung cancer Mother    Allergies  Allergen Reactions  . Atorvastatin Other (See Comments)     Muscle and joint pain   . Rosuvastatin Other (See Comments)    Myalgia's Severe joint pain  . Statins Other (See Comments)    Severe joint pain   Prior to Admission medications   Medication Sig Start Date End Date Taking? Authorizing Provider  apixaban (ELIQUIS) 2.5 MG TABS tablet Take 2.5 mg by mouth 2 (two) times daily.   Yes [provider]  celecoxib (CELEBREX) 200 MG capsule Take 200 mg by mouth daily.   Yes [provider]  cyclobenzaprine (FLEXERIL) 5 MG tablet Take 5 mg by mouth 2 (two) times daily.   Yes [provider]  escitalopram (LEXAPRO) 10 MG tablet Take 10 mg by mouth daily.   Yes [provider]  gabapentin (NEURONTIN) 600 MG tablet Take 600 mg by mouth 3 (three) times daily.   Yes [provider]  HYDROcodone-acetaminophen (NORCO) 10-325 MG tablet Take 1 tablet by  mouth 2 (two) times daily.   Yes [provider]  lisinopril (PRINIVIL,ZESTRIL) 10 MG tablet Take 10 mg by mouth daily.   Yes [provider]  methocarbamol (ROBAXIN) 500 MG tablet Take 500 mg by mouth 2 (two) times daily.   Yes [provider]  traZODone (DESYREL) 150 MG tablet Take 150 mg by mouth at bedtime.   Yes [provider]     All other systems have been reviewed and were otherwise negative with the exception of those mentioned in the HPI and as above.  Physical Exam: There were no vitals filed for this visit.  There is no height or weight on file to calculate BMI.  General: Alert, no acute distress Cardiovascular: No pedal edema Respiratory: No cyanosis, no use of accessory musculature Skin: No lesions in the area of chief complaint Neurologic: Sensation intact distally Psychiatric: Patient is competent for consent with normal mood and  affect Lymphatic: No axillary or cervical lymphadenopathy  MUSCULOSKELETAL: + spurling's sign bilaterally  Assessment/Plan: BILATERAL ARM PAIN Plan for Procedure(s): ANTERIOR CERVICAL DECOMPRESSION FUSION, CERVICAL 4-5, CERVICAL 5-6, CERVICAL 6-7 WITH INSTRUMENTATION AND ALLOGRAFT   Emilee Hero, MD 11/13/2017 6:38 AM

## 2017-11-13 NOTE — Transfer of Care (Addendum)
Immediate Anesthesia Transfer of Care Note  Patient: William Mcclain  Procedure(s) Performed: ANTERIOR CERVICAL DECOMPRESSION FUSION, CERVICAL FOUR-FIVE, CERVICAL FIVE-SIX, CERVICAL SIX-SEVEN WITH INSTRUMENTATION AND ALLOGRAFT. (Bilateral )  Patient Location: PACU  Anesthesia Type:General  Level of Consciousness: awake, oriented and patient cooperative  Airway & Oxygen Therapy: Patient Spontanous Breathing and Patient connected to face mask oxygen  Post-op Assessment: Report given to RN and Post -op Vital signs reviewed and stable  Post vital signs: Reviewed and stable  Last Vitals:  Vitals Value Taken Time  BP    Temp 36.8 C 11/13/2017  1:48 PM  Pulse 80 11/13/2017  1:49 PM  Resp 21 11/13/2017  1:49 PM  SpO2 100 % 11/13/2017  1:49 PM  Vitals shown include unvalidated device data.  Last Pain:  Vitals:   11/13/17 0803  TempSrc:   PainSc: 0-No pain         Complications: No apparent anesthesia complications

## 2017-11-14 DIAGNOSIS — M5412 Radiculopathy, cervical region: Secondary | ICD-10-CM | POA: Diagnosis not present

## 2017-11-14 NOTE — Progress Notes (Signed)
    Patient doing well PO day 1, resolved pre-op BIL arm symptoms including his numbness, pt very pleased. He had some prominent posterior neck pain and upper back pain last night but this has calmed down and he is quite comfortable and eager to progress home. HTN has been noted and lisinipril increased he states he is pending F/U with PCP  Physical Exam: Vitals:   11/14/17 0625 11/14/17 0755  BP: (!) 163/89 (!) 147/95  Pulse: 77 85  Resp:  18  Temp:  98.9 F (37.2 C)  SpO2:  98%    Dressing in place,neck soft and  Supple, hard collar worn appropriately NVI  POD #1 s/p ACDF doing excellent   - encourage ambulation - Percocet for pain, Valium for muscle spasms - likely d/c home today with f/u in 2 weeks

## 2017-11-14 NOTE — Progress Notes (Signed)
Patiwent placed on CPAP Auto mode and doing well. Has home mask and tubing.

## 2017-11-14 NOTE — Progress Notes (Signed)
Pt doing well. Pt and wife given D/C instructions with Rx's, verbal understanding was provided. Pt's incision is clean and dry with no sign of infection. Pt's IV was removed prior to D/C. Pt D/C'd home via walking @ 0950 per MD order. Pt is stable @ D/C and has no other needs at this time. Vanesha Athens, RN  

## 2017-11-17 ENCOUNTER — Encounter (HOSPITAL_COMMUNITY): Payer: Self-pay | Admitting: Orthopedic Surgery

## 2017-11-27 NOTE — Discharge Summary (Signed)
Patient ID: William Mcclain MRN: [id number] DOB/AGE: [birth year] 62 y.o.  Admit date: 11/13/2017 Discharge date: 11/14/2017  Admission Diagnoses:  Active Problems:   Radiculopathy   Discharge Diagnoses:  Same  Past Medical History:  Diagnosis Date  . Arthritis   . Depression   . 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 Hypertension   . Pneumonia   . Sleep apnea     Surgeries: Procedure(s): ANTERIOR CERVICAL DECOMPRESSION FUSION, CERVICAL FOUR-FIVE, CERVICAL FIVE-SIX, CERVICAL SIX-SEVEN WITH INSTRUMENTATION AND ALLOGRAFT. on 11/13/2017   Consultants: None  Discharged Condition: Improved  Hospital Course: William Mcclain is an 62 y.o. male who was admitted 11/13/2017 for operative treatment of radiculopathy. Patient has severe unremitting pain that affects sleep, daily activities, and work/hobbies. After pre-op clearance the patient was taken to the operating room on 11/13/2017 and underwent  Procedure(s): ANTERIOR CERVICAL DECOMPRESSION FUSION, CERVICAL FOUR-FIVE, CERVICAL FIVE-SIX, CERVICAL SIX-SEVEN WITH INSTRUMENTATION AND ALLOGRAFT.Marland Kitchen    Patient was given perioperative antibiotics:  Anti-infectives (From admission, onward)   Start     Dose/Rate Route Frequency Ordered Stop   11/13/17 1800  ceFAZolin (ANCEF) IVPB 2g/100 mL premix     2 g 200 mL/hr over 30 Minutes Intravenous Every 8 hours 11/13/17 1524 11/14/17 0444   11/13/17 0745  ceFAZolin (ANCEF) IVPB 2g/100 mL premix     2 g 200 mL/hr over 30 Minutes Intravenous On call to O.R. 11/13/17 0739 11/13/17 1030       Patient was given sequential compression devices, early ambulation to prevent DVT.  Patient benefited maximally from hospital stay and there were no complications.    Recent vital signs: BP (!) 147/95 (BP Location: Right Arm)   Pulse 85   Temp 98.9 F (37.2 C) (Oral)   Resp 18   Ht 5\' 9"  (1.753 m)   Wt 96.7 kg (213 lb 3.2 oz)   SpO2 98%   BMI 31.48 kg/m    Discharge  Medications:   Allergies as of 11/14/2017      Reactions   Atorvastatin Other (See Comments)   Muscle and joint pain    Rosuvastatin Other (See Comments)   Myalgia's Severe joint pain   Statins Other (See Comments)   Severe joint pain      Medication List    STOP taking these medications   ELIQUIS 2.5 MG Tabs tablet Generic drug:  apixaban     TAKE these medications   escitalopram 10 MG tablet Commonly known as:  LEXAPRO Take 10 mg by mouth daily.   gabapentin 600 MG tablet Commonly known as:  NEURONTIN Take 600 mg by mouth 3 (three) times daily.   lisinopril 10 MG tablet Commonly known as:  PRINIVIL,ZESTRIL Take 10 mg by mouth daily.   traZODone 150 MG tablet Commonly known as:  DESYREL Take 150 mg by mouth at bedtime.       Diagnostic Studies: Dg Chest 2 View  Result Date: 11/11/2017 CLINICAL DATA:  Cervical surgery.  Preoperative exam. EXAM: CHEST - 2 VIEW COMPARISON:  No prior. FINDINGS: Mediastinum and hilar structures are normal. Lungs are clear. No pleural effusion or pneumothorax. Degenerative change thoracic spine. No acute bony abnormality. IMPRESSION: No acute cardiopulmonary disease. Electronically Signed   By: Maisie Fus  Register   On: 11/11/2017 12:34   Dg Cervical Spine 1 View  Result Date: 11/13/2017 CLINICAL DATA:  Anterior fusion C4-C7 EXAM: DG C-ARM 61-120 MIN; DG CERVICAL SPINE - 1 VIEW COMPARISON:  Cervical  MRI Oct 07, 2017 FLUOROSCOPY TIME:  0 minutes 22 seconds; 6 acquired images FINDINGS: Initial lateral cervical image showed a metallic probe anterior to the C5-6 interspace level. A second lateral image showed probe anterior to the C4-5 interspace level. Subsequent lateral images show anterior screw and plate fixation from C4-C7 with disc spacers at C4-5, C5-6, and C6-7. Support hardware appears intact. No fracture or spondylolisthesis. IMPRESSION: Anterior fusion from C4-C7 with support hardware intact. Disc spacers are noted at C4-5, C5-6, and C6-7.  No fracture or spondylolisthesis. Electronically Signed   By: Bretta BangWilliam  Woodruff III M.D.   On: 11/13/2017 13:16   Dg C-arm 1-60 Min  Result Date: 11/13/2017 CLINICAL DATA:  Anterior fusion C4-C7 EXAM: DG C-ARM 61-120 MIN; DG CERVICAL SPINE - 1 VIEW COMPARISON:  Cervical MRI Oct 07, 2017 FLUOROSCOPY TIME:  0 minutes 22 seconds; 6 acquired images FINDINGS: Initial lateral cervical image showed a metallic probe anterior to the C5-6 interspace level. A second lateral image showed probe anterior to the C4-5 interspace level. Subsequent lateral images show anterior screw and plate fixation from C4-C7 with disc spacers at C4-5, C5-6, and C6-7. Support hardware appears intact. No fracture or spondylolisthesis. IMPRESSION: Anterior fusion from C4-C7 with support hardware intact. Disc spacers are noted at C4-5, C5-6, and C6-7. No fracture or spondylolisthesis. Electronically Signed   By: Bretta BangWilliam  Woodruff III M.D.   On: 11/13/2017 13:16    Disposition: Discharge disposition: 01-Home or Self Care       Discharge Instructions    Discharge patient   Complete by:  As directed    Discharge disposition:  01-Home or Self Care   Discharge patient date:  11/14/2017     POD #1 s/p ACDF doing excellent   - encourage ambulation - Percocet for pain, Valium for muscle spasms -Written scripts for pain signed and in chart -D/C instructions sheet printed and in chart -D/C today  -F/U in office 2 weeks   Signed: Georga BoraMCKENZIE, Sade Hollon J 11/27/2017, 7:42 AM

## 2017-12-08 ENCOUNTER — Other Ambulatory Visit: Payer: Self-pay | Admitting: Orthopedic Surgery

## 2017-12-08 DIAGNOSIS — M545 Low back pain: Secondary | ICD-10-CM

## 2017-12-15 ENCOUNTER — Ambulatory Visit
Admission: RE | Admit: 2017-12-15 | Discharge: 2017-12-15 | Disposition: A | Discharge status: Home / Self Care | Payer: Medicare HMO | Source: Ambulatory Visit | Attending: Orthopedic Surgery | Admitting: Orthopedic Surgery

## 2017-12-15 DIAGNOSIS — M545 Low back pain: Secondary | ICD-10-CM

## 2018-03-06 ENCOUNTER — Other Ambulatory Visit: Payer: Self-pay | Admitting: Orthopedic Surgery

## 2018-03-06 DIAGNOSIS — M5416 Radiculopathy, lumbar region: Secondary | ICD-10-CM

## 2018-03-19 ENCOUNTER — Ambulatory Visit
Admission: RE | Admit: 2018-03-19 | Discharge: 2018-03-19 | Disposition: A | Discharge status: Home / Self Care | Payer: Medicare HMO | Source: Ambulatory Visit | Attending: Orthopedic Surgery | Admitting: Orthopedic Surgery

## 2018-03-19 DIAGNOSIS — M5416 Radiculopathy, lumbar region: Secondary | ICD-10-CM

## 2020-03-12 IMAGING — MR MR LUMBAR SPINE W/O CM
4 of 5 series · 25 of 48 positions shown · non-contrast
Comparison: CT same day, 12/15/2017 and 11/14/2016. MRI 11/14/2016.

CLINICAL DATA: Low back pain radiating to both buttocks. Bilateral
leg weakness and numbness. Previous lumbar surgery 1725 and 1111.

EXAM:
MRI LUMBAR SPINE WITHOUT CONTRAST
TECHNIQUE: Multiplanar, multisequence MR imaging of the lumbar spine was
performed. No intravenous contrast was administered.

[Series 3: T2 · sagittal · 4.0mm · 0.73mm/px · 6 of 16 slices shown (1 of 2)]
[im 1/16]
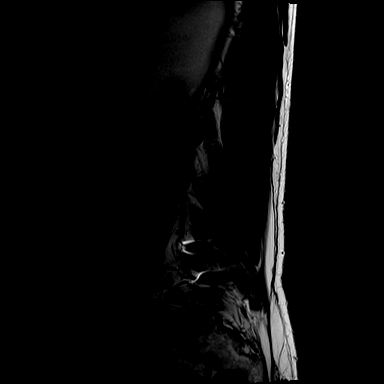
[im 4/16]
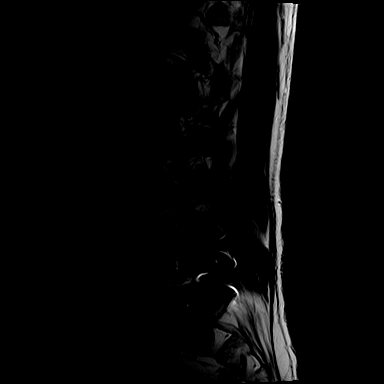
[im 7/16]
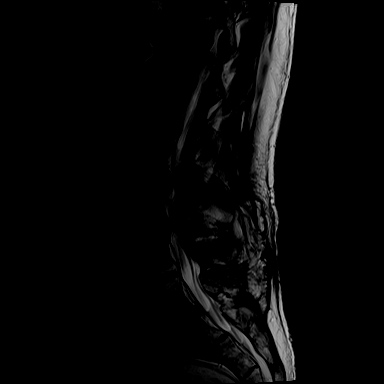
[im 10/16]
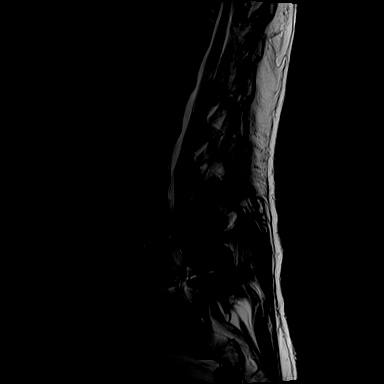
[im 13/16]
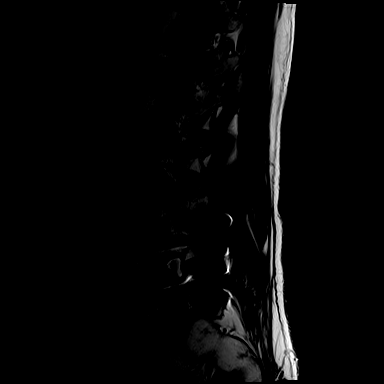
[im 16/16]
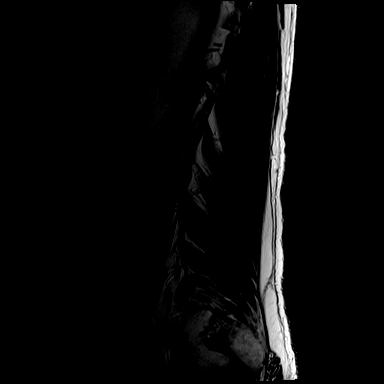

[Series 5: T1 · sagittal · 4.0mm · 1.09mm/px · 6 of 16 slices shown (1 of 2)]
[im 1/16]
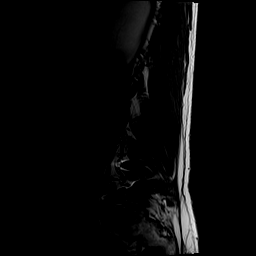
[im 4/16]
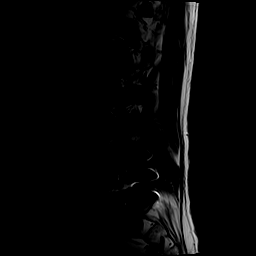
[im 7/16]
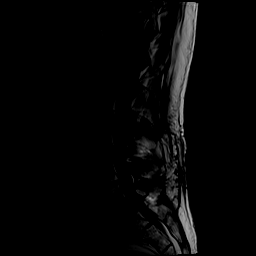
[im 10/16]
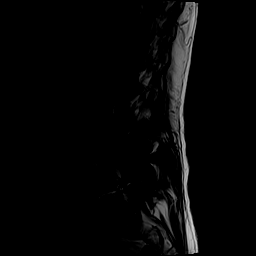
[im 13/16]
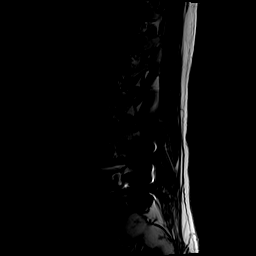
[im 16/16]
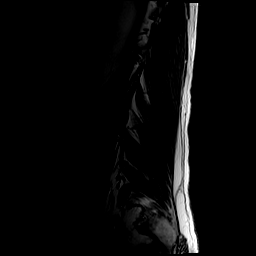

[Series 6: T2 · axial · 4.0mm · 0.39mm/px · z∈[-93,+142]mm · 9 of 42 slices shown (2 of 2)]
[im 1/42]
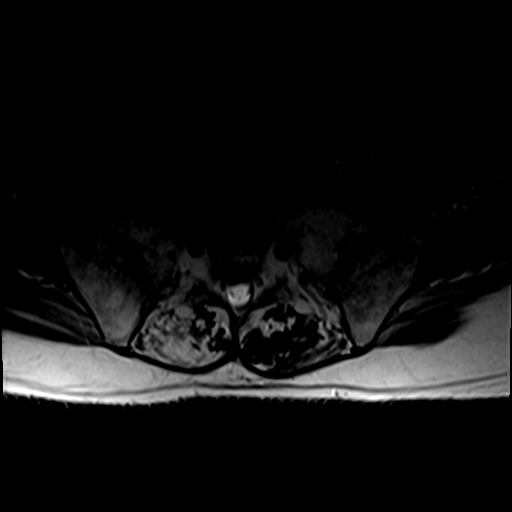
[im 6/42]
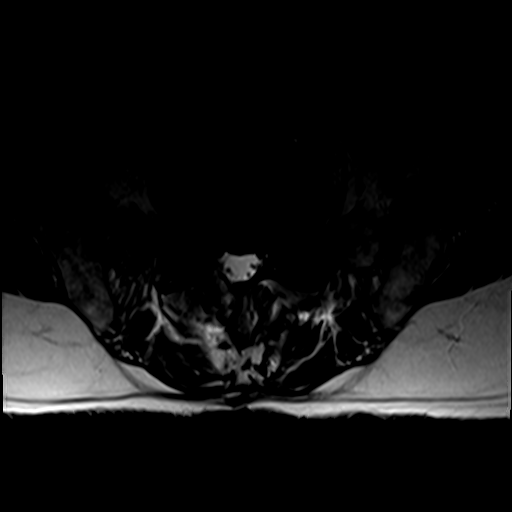
[im 12/42]
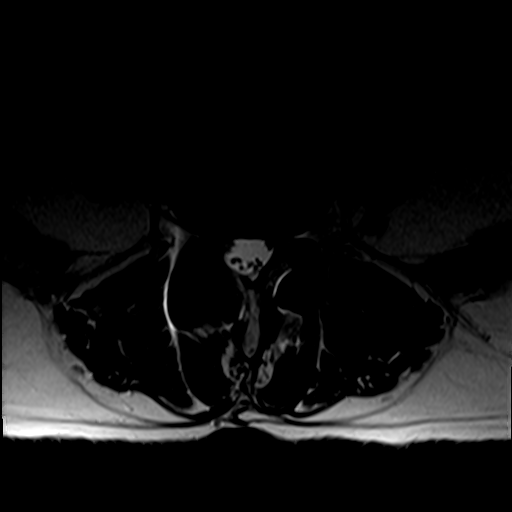
[im 18/42]
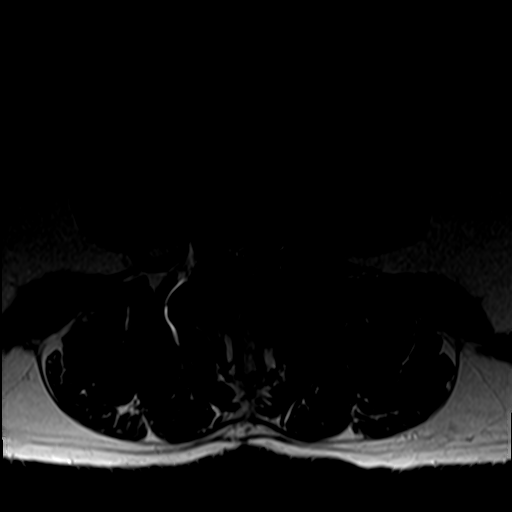
[im 21/42]
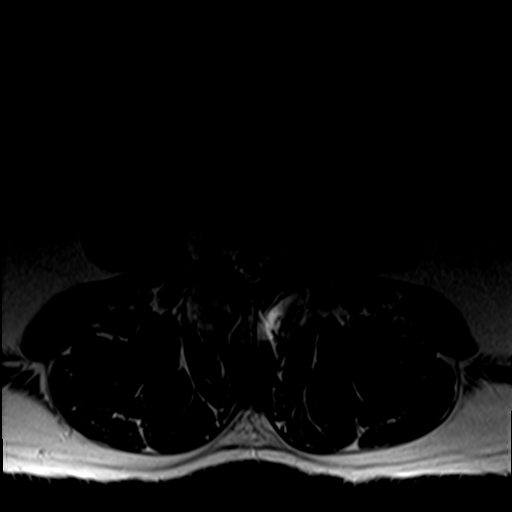
[im 24/42]
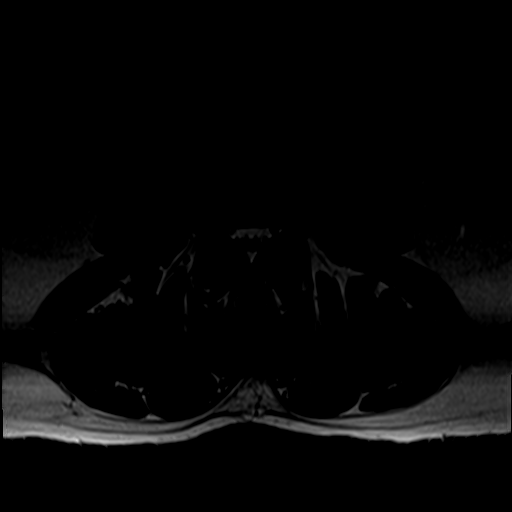
[im 30/42]
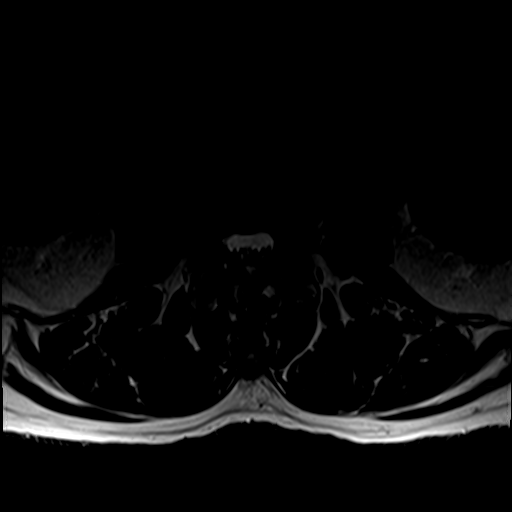
[im 36/42]
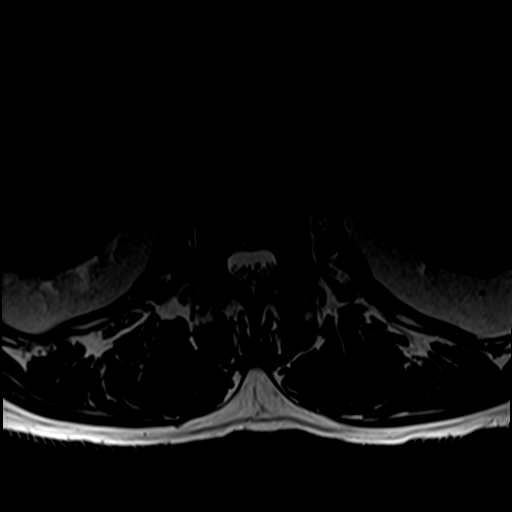
[im 42/42]
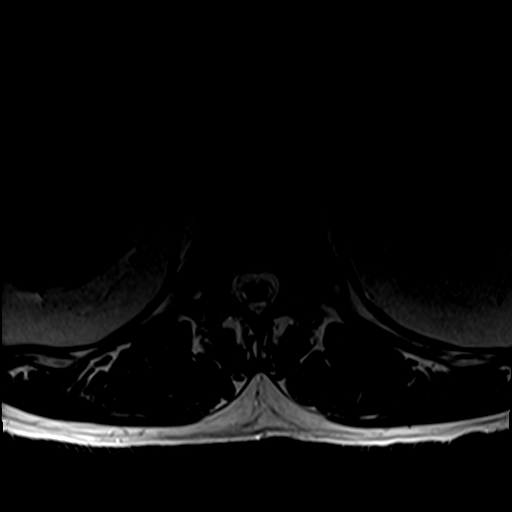

[Series 7: T1 · axial · 4.0mm · 0.39mm/px · z∈[-93,+113]mm · 4 of 42 slices shown (2 of 2)]
[im 1/42]
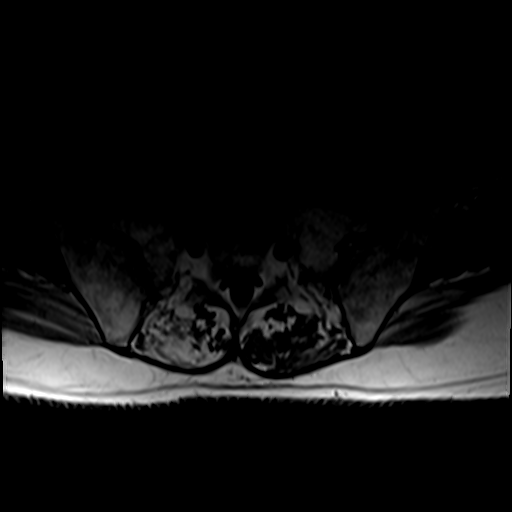
[im 6/42]
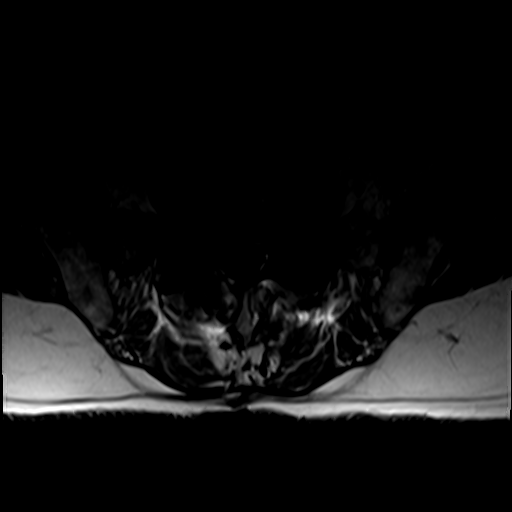
[im 21/42]
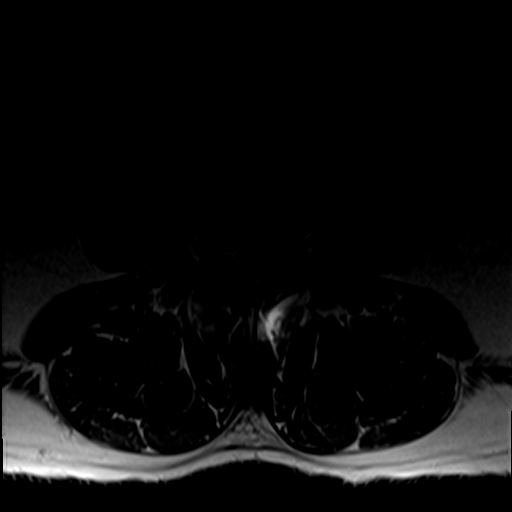
[im 36/42]
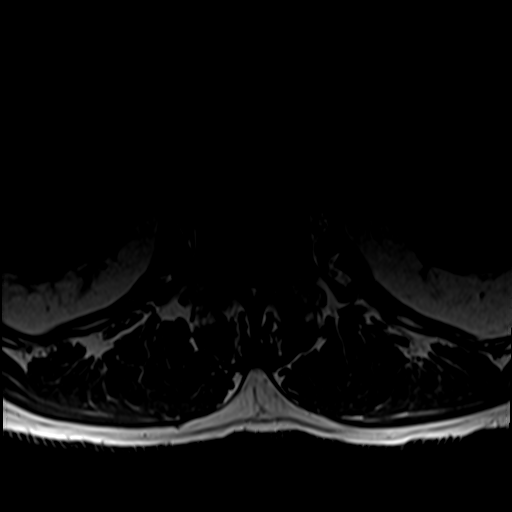

[25 of 48 positions shown; findings below may reference images not displayed]

FINDINGS: Segmentation: 5 lumbar type vertebral bodies as numbered previously.

Alignment:  Normal

Vertebrae: No fracture or primary bone lesion. No edematous endplate
changes.

Conus medullaris and cauda equina: Conus extends to the L1 level.
Conus and cauda equina appear normal.

Paraspinal and other soft tissues: Negative

Disc levels:

No significant finding at L2-3 or above. Minimal non-compressive
disc bulges. No canal or foraminal stenosis.

L3-4: Mild bulging of the disc. Mild facet and ligamentous
hypertrophy. Small synovial cyst projecting inward from the facet on
the right, better visible by CT. Mild stenosis of both lateral
recesses but without definite neural compression. Foraminal
narrowing left more than right.

L4-5: Previous posterior decompression and fusion. No MR evidence of
ongoing motion. Wide patency of the canal and foramina.

L5-S1: Degenerative disc disease, slowly progressive over time.
Endplate osteophytes and bulging of the disc. Rather prominent
anterior disc bulging. Mild facet hypertrophy. No compressive canal
stenosis. Bilateral foraminal narrowing that could affect either L5
nerve. Definite compression is not established.
IMPRESSION: Satisfactory appearance at the fusion level of L4-5.

L5-S1: Solid progressive degenerative disc disease. Endplate
osteophytes and bulging of the disc. Mild facet degeneration. No
compressive central canal stenosis. Foraminal narrowing that would
have some potential to affect the L5 nerves. Anterior bulging of the
disc. Discogenic endplate changes do not show gross edematous
features.

L3-4: Bulging of the disc. Facet and ligamentous hypertrophy. Small
synovial cyst projecting inward on the right, better shown by CT.
Mild stenosis the lateral recesses which could possibly be
significant. Mild foraminal narrowing left more than right.

## 2022-08-14 ENCOUNTER — Other Ambulatory Visit: Payer: Self-pay | Admitting: Orthopedic Surgery

## 2022-08-14 ENCOUNTER — Other Ambulatory Visit: Payer: Self-pay

## 2022-08-14 DIAGNOSIS — M960 Pseudarthrosis after fusion or arthrodesis: Secondary | ICD-10-CM

## 2022-08-14 DIAGNOSIS — T84296A Other mechanical complication of internal fixation device of vertebrae, initial encounter: Secondary | ICD-10-CM

## 2022-08-26 ENCOUNTER — Ambulatory Visit
Admission: RE | Admit: 2022-08-26 | Discharge: 2022-08-26 | Disposition: A | Discharge status: Home / Self Care | Payer: Medicare Other | Source: Ambulatory Visit | Attending: Orthopedic Surgery | Admitting: Orthopedic Surgery

## 2022-08-26 DIAGNOSIS — M960 Pseudarthrosis after fusion or arthrodesis: Secondary | ICD-10-CM

## 2022-08-28 ENCOUNTER — Ambulatory Visit
Admission: RE | Admit: 2022-08-28 | Discharge: 2022-08-28 | Disposition: A | Discharge status: Home / Self Care | Payer: Medicare Other | Source: Ambulatory Visit | Attending: Orthopedic Surgery | Admitting: Orthopedic Surgery

## 2022-08-28 DIAGNOSIS — T84296A Other mechanical complication of internal fixation device of vertebrae, initial encounter: Secondary | ICD-10-CM

## 2022-08-28 DIAGNOSIS — M960 Pseudarthrosis after fusion or arthrodesis: Secondary | ICD-10-CM

## 2022-09-12 ENCOUNTER — Other Ambulatory Visit: Payer: Medicare HMO

## 2023-12-20 ENCOUNTER — Other Ambulatory Visit (HOSPITAL_COMMUNITY): Payer: Self-pay | Admitting: Family Medicine

## 2023-12-20 DIAGNOSIS — R0600 Dyspnea, unspecified: Secondary | ICD-10-CM

## 2024-01-15 ENCOUNTER — Encounter (HOSPITAL_COMMUNITY): Payer: Self-pay
